# Patient Record
Sex: Female | Born: 1995 | Race: Black or African American | Hispanic: No | Smoking: Never smoker
Health system: Southern US, Community
[De-identification: ages and names within clinical notes are randomized; demographics above are authoritative.]

## PROBLEM LIST (undated history)

## (undated) ENCOUNTER — Emergency Department (HOSPITAL_COMMUNITY): Discharge status: Absent without leave | Payer: Self-pay

## (undated) DIAGNOSIS — T7840XA Allergy, unspecified, initial encounter: Secondary | ICD-10-CM

## (undated) HISTORY — DX: Allergy, unspecified, initial encounter: T78.40XA

---
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
## 2014-02-03 ENCOUNTER — Ambulatory Visit: Payer: Self-pay | Admitting: Family Medicine

## 2014-02-25 ENCOUNTER — Ambulatory Visit (INDEPENDENT_AMBULATORY_CARE_PROVIDER_SITE_OTHER): Payer: Medicaid Other | Admitting: Family Medicine

## 2014-02-25 ENCOUNTER — Encounter: Payer: Self-pay | Admitting: Family Medicine

## 2014-02-25 VITALS — BP 101/51 | HR 79 | Temp 98.2°F | Ht 61.0 in | Wt 96.6 lb

## 2014-02-25 DIAGNOSIS — Z9109 Other allergy status, other than to drugs and biological substances: Secondary | ICD-10-CM

## 2014-02-25 DIAGNOSIS — Z889 Allergy status to unspecified drugs, medicaments and biological substances status: Secondary | ICD-10-CM

## 2014-02-25 MED ORDER — FLUTICASONE PROPIONATE 50 MCG/ACT NA SUSP: 2.0000 | Freq: Every day | NASAL | Status: AC

## 2014-02-25 MED ORDER — CETIRIZINE HCL 10 MG PO TABS: 10.0000 mg | ORAL_TABLET | Freq: Every day | ORAL | Status: AC

## 2014-02-25 NOTE — Patient Instructions (Signed)
Safe Sex Safe sex is about reducing the risk of giving or getting a sexually transmitted disease (STD). STDs are spread through sexual contact involving the genitals, mouth, or rectum. Some STDs can be cured and others cannot. Safe sex can also prevent unintended pregnancies.  WHAT ARE SOME SAFE SEX PRACTICES?  Limit your sexual activity to only one partner who is only having sex with you.  Talk to your partner about his or her past partners, past STDs, and drug use.  Use a condom every time you have sexual intercourse. This includes vaginal, oral, and anal sexual activity. Both females and males should wear condoms during oral sex. Only use latex or polyurethane condoms and water-based lubricants. Using petroleum-based lubricants or oils to lubricate a condom will weaken the condom and increase the chance that it will break. The condom should be in place from the beginning to the end of sexual activity. Wearing a condom reduces, but does not completely eliminate, your risk of getting or giving an STD. STDs can be spread by contact with infected body fluids and skin.  Get vaccinated for hepatitis B and HPV.  Avoid alcohol and recreational drugs which can affect your judgement. You may forget to use a condom or participate in high-risk sex.  For females, avoid douching after sexual intercourse. Douching can spread an infection farther into the reproductive tract.  Check your body for signs of sores, blisters, rashes, or unusual discharge. See your health care provider if you notice any of these signs.  Avoid sexual contact if you have symptoms of an infection or are being treated for an STD. If you or your partner has herpes, avoid sexual contact when blisters are present. Use condoms at all other times.  If you are at risk of being infected with HIV, it is recommended that you take a prescription medicine daily to prevent HIV infection. This is called pre-exposure prophylaxis (PrEP). You are  considered at risk if:  You are a man who has sex with other men (MSM).  You are a heterosexual man or woman who is sexually active with more than one partner.  You take drugs by injection.  You are sexually active with a partner who has HIV.  Talk with your health care provider about whether you are at high risk of being infected with HIV. If you choose to begin PrEP, you should first be tested for HIV. You should then be tested every 3 months for as long as you are taking PrEP.  See your health care provider for regular screenings, exams, and tests for other STDs. Before having sex with a new partner, each of you should be screened for STDs and should talk about the results with each other. WHAT ARE THE BENEFITS OF SAFE SEX?   There is less chance of getting or giving an STD.  You can prevent unwanted or unintended pregnancies.  By discussing safe sex concerns with your partner, you may increase feelings of intimacy, comfort, trust, and honesty between the two of you. Document Released: 09/20/2004 Document Revised: 08/18/2013 Document Reviewed: 02/04/2012 [facility] Patient Information 2015 [place], [place]. This information is not intended to replace advice given to you by your health care provider. Make sure you discuss any questions you have with your health care provider.  Allergies  Allergies may happen from anything your body is sensitive to. This may be food, medicines, pollens, chemicals, and many other things. Food allergies can be severe and deadly.  HOME CARE  If  you do not know what causes a reaction, keep a diary. Write down the foods you ate and the symptoms that followed. Avoid foods that cause reactions.  If you have red raised spots (hives) or a rash:  Take medicine as told by your doctor.  Use medicines for red raised spots and itching as needed.  Apply cold cloths (compresses) to the skin. Take a cool bath. Avoid hot baths or showers.  If you are severely  allergic:  It is often necessary to go to the hospital after you have treated your reaction.  Wear your medical alert jewelry.  You and your family must learn how to give a allergy shot or use an allergy kit (anaphylaxis kit).  Always carry your allergy kit or shot with you. Use this medicine as told by your doctor if a severe reaction is occurring. GET HELP RIGHT AWAY IF:  You have trouble breathing or are making high-pitched whistling sounds (wheezing).  You have a tight feeling in your chest or throat.  You have a puffy (swollen) mouth.  You have red raised spots, puffiness (swelling), or itching all over your body.  You have had a severe reaction that was helped by your allergy kit or shot. The reaction can return once the medicine has worn off.  You think you are having a food allergy. Symptoms most often happen within 30 minutes of eating a food.  Your symptoms have not gone away within 2 days or are getting worse.  You have new symptoms.  You want to retest yourself with a food or drink you think causes an allergic reaction. Only do this under the care of a doctor. MAKE SURE YOU:   Understand these instructions.  Will watch your condition.  Will get help right away if you are not doing well or get worse. Document Released: 12/08/2012 Document Reviewed: 12/08/2012 [facility] Patient Information 2015 [place]. This information is not intended to replace advice given to you by your health care provider. Make sure you discuss any questions you have with your health care provider.   It was a pleasure meeting you today. If you need anything do not hesitate to call for an appointment.

## 2014-02-27 NOTE — Progress Notes (Signed)
   Subjective:    Patient ID: Alison Larson, female    DOB: July 14, 1996, 18 y.o.   MRN: [id number]  Alison Larson is a 18 y.o. female [facility] for establishment of care.   Establishment of care: Pt has no complaints today.   PMH:  She has a history of seasonal allergies and takes OTC zyrtec when needed.   Patient's last menstrual period was 02/07/2014. Her cycles are regular every 30 days and sometimes heavy. They last about 4-5 days. Pt  Is sexually active and uses condoms for protection. She endorses strong cramps with her cycles. She does not desire any other  form of birth control at this time.   Immunizations are UTD.   FH: Maternal grandmother/father: Breast cancer (grandmother), Dementia (both) Maternal side: diabetes and hypertension (multiple relatives on mother side)   Social: 18 y.o. sexually active female. Condoms for protection. Safe in her relationship. No depression signs. Gravida 0. Works part-time at [employer]. Currently attending college. She would like to be an OB/GYN. Lives with Mom, 2 sisters. Walks daily for exercise. Never a smoker. No recreational drug or alcohol use.   Patient's past medical, social, and family history were reviewed and updated as appropriate.  Review of Systems Per Alison    Objective:   Physical Exam BP 101/51  Pulse 79  Temp(Src) 98.2 F (36.8 C) (Oral)  Ht 5\' 1"  (1.549 m)  Wt 96 lb 9.6 oz (43.817 kg)  BMI 18.26 kg/m2  LMP 02/07/2014 Gen: Very pleasant 18 year old AAF. Well developed, well nourished, thin. No acute distress, non toxic in appearance.   HEENT: AT. [place]. Bilateral TM visualized and normal in appearance. Bilateral eyes without injections or icterus. MMM. Bilateral nares with erythema and swelling. Throat without erythema or exudates.  CV: RRR, No murmur, clicks, gallops or rubs appreciated.  Chest: CTAB, no wheeze or crackles Abd: Soft. Thin. NTND. BS Present. No Masses palpated.  Ext: No  erythema. No edema. Bilateral pulses 2/4.  Skin: No rashes, purpura or petechiae.  Neuro: Normal gait. PERLA. EOMi. Alert. CN 2-12 intact. 5/5 MS bilateral UE/LE. DTRs equal bilateral.  Psych: Normal affect, dress and mood. Normal Speech.

## 2014-02-27 NOTE — Assessment & Plan Note (Signed)
Prescribed flonase and zyrtec.  F/O 2 weeks if no improvement, or sooner if needed.

## 2014-10-28 ENCOUNTER — Telehealth: Payer: Self-pay | Admitting: Family Medicine

## 2014-10-28 NOTE — Telephone Encounter (Signed)
Patient call reporting vaginal bleeding. Patient states that she had her mental cycle earlier in February. She states the bleeding resolved on February 25.  She states she had additional bleeding starting on March 1 and this has continued. She would like to know whether she needs to be seen for further workup. I advised her that this does not seem emergent/urgent. She can call for an appointment at her convenience.
